# Patient Record
Sex: Female | Born: 1970 | Race: White | Hispanic: No | Marital: Married | Smoking: Never smoker
Health system: Southern US, Community
[De-identification: ages and names within clinical notes are randomized; demographics above are authoritative.]

## PROBLEM LIST (undated history)

## (undated) DIAGNOSIS — I10 Essential (primary) hypertension: Secondary | ICD-10-CM

## (undated) HISTORY — PX: LEEP: SHX91

## (undated) HISTORY — DX: Essential (primary) hypertension: I10

---

## 2013-01-06 ENCOUNTER — Encounter (HOSPITAL_BASED_OUTPATIENT_CLINIC_OR_DEPARTMENT_OTHER): Payer: Self-pay | Admitting: Emergency Medicine

## 2013-01-06 ENCOUNTER — Emergency Department (HOSPITAL_BASED_OUTPATIENT_CLINIC_OR_DEPARTMENT_OTHER): Payer: Managed Care, Other (non HMO)

## 2013-01-06 ENCOUNTER — Emergency Department (HOSPITAL_BASED_OUTPATIENT_CLINIC_OR_DEPARTMENT_OTHER)
Admission: EM | Admit: 2013-01-06 | Discharge: 2013-01-06 | Disposition: A | Discharge status: Home / Self Care | Payer: Managed Care, Other (non HMO) | Attending: Emergency Medicine | Admitting: Emergency Medicine

## 2013-01-06 DIAGNOSIS — S8990XA Unspecified injury of unspecified lower leg, initial encounter: Secondary | ICD-10-CM | POA: Insufficient documentation

## 2013-01-06 DIAGNOSIS — S90212A Contusion of left great toe with damage to nail, initial encounter: Secondary | ICD-10-CM

## 2013-01-06 DIAGNOSIS — W208XXA Other cause of strike by thrown, projected or falling object, initial encounter: Secondary | ICD-10-CM | POA: Insufficient documentation

## 2013-01-06 DIAGNOSIS — S90129A Contusion of unspecified lesser toe(s) without damage to nail, initial encounter: Secondary | ICD-10-CM | POA: Insufficient documentation

## 2013-01-06 DIAGNOSIS — Y929 Unspecified place or not applicable: Secondary | ICD-10-CM | POA: Insufficient documentation

## 2013-01-06 DIAGNOSIS — Y939 Activity, unspecified: Secondary | ICD-10-CM | POA: Insufficient documentation

## 2013-01-06 DIAGNOSIS — S99922A Unspecified injury of left foot, initial encounter: Secondary | ICD-10-CM

## 2013-01-06 DIAGNOSIS — S99929A Unspecified injury of unspecified foot, initial encounter: Secondary | ICD-10-CM

## 2013-01-06 MED ORDER — OXYCODONE-ACETAMINOPHEN 5-325 MG PO TABS: 1.0000 | ORAL_TABLET | Freq: Four times a day (QID) | ORAL | Status: DC | PRN

## 2013-01-06 MED ORDER — ACETAMINOPHEN 325 MG PO TABS
650.0000 mg | ORAL_TABLET | Freq: Once | ORAL | Status: AC
  Administered 2013-01-06: 650 mg via ORAL
  Filled 2013-01-06: qty 2

## 2013-01-06 NOTE — ED Provider Notes (Signed)
CSN: [id number]     Arrival date & time 01/06/13  1851 History  This chart was scribed for Junius Argyle, MD by Bennett Scrape, ED Scribe. This patient was seen in room MH01/MH01 and the patient's care was started at 8:17 PM.   Chief Complaint  Patient presents with  . Toe Injury    Patient is a 42 y.o. female presenting with toe pain. The history is provided by the patient. No language interpreter was used.  Toe Pain This is a new problem. Episode onset: this morning. The problem occurs constantly. The problem has been gradually worsening. Pertinent negatives include no chest pain, no abdominal pain, no headaches and no shortness of breath. The symptoms are aggravated by walking. The symptoms are relieved by rest. Treatments tried: Advil. The treatment provided mild relief.    HPI Comments: Alice Young is a 42 y.o. female who presents to the Emergency Department complaining of left great toe pain that started this morning after she dropped her trash can on it. She reports that the trash can had a metal ring along the bottom which hit the toenail head on and reports that she has since then been on her feet all day at work worsening the pain. She describes the pain as a throbbing and stabbing pain that is worse with weight bearing. She rates her pain an 8 out of 10 currently but states that she has not experienced decreased ROM due to the pain. She states that she took one Advil with mild improvement during the original onset but has not taken anything else since. She denies any other injuries.  No past medical history on file. No past surgical history on file. No family history on file. History  Substance Use Topics  . Smoking status: Never Smoker   . Smokeless tobacco: Not on file  . Alcohol Use: Not on file   No OB history provided.  Review of Systems  Constitutional: Negative for fever and fatigue.  HENT: Negative for congestion and drooling.   Eyes: Negative for pain.   Respiratory: Negative for cough and shortness of breath.   Cardiovascular: Negative for chest pain.  Gastrointestinal: Negative for nausea, vomiting, abdominal pain and diarrhea.  Genitourinary: Negative for dysuria and hematuria.  Musculoskeletal: Positive for arthralgias. Negative for neck pain.  Skin: Positive for wound. Negative for color change.  Neurological: Negative for dizziness and headaches.  Hematological: Negative for adenopathy.  Psychiatric/Behavioral: Negative for behavioral problems.  All other systems reviewed and are negative.    Allergies  Erythromycin base  Home Medications  No current outpatient prescriptions on file.  Triage Vitals: BP 128/87  Pulse 86  Temp(Src) 98.3 F (36.8 C)  Resp 16  Ht 5\' 3"  (1.6 m)  Wt 141 lb (63.957 kg)  BMI 24.98 kg/m2  SpO2 100%  LMP 01/03/2013  Physical Exam  Nursing note and vitals reviewed. Constitutional: She is oriented to person, place, and time. She appears well-developed and well-nourished. No distress.  HENT:  Head: Normocephalic and atraumatic.  Eyes: EOM are normal.  Neck: Neck supple. No tracheal deviation present.  Cardiovascular: Normal rate and regular rhythm.   Pulmonary/Chest: Effort normal and breath sounds normal. No respiratory distress.  Abdominal: Soft. There is no tenderness.  Musculoskeletal: Normal range of motion.  Subungual hematoma of left great toe , 2+ distal pulses, normal ROM  Neurological: She is alert and oriented to person, place, and time.  Skin: Skin is warm and dry.  Psychiatric: She has a  normal mood and affect. Her behavior is normal.    ED Course  Procedures (including critical care time)  DIAGNOSTIC STUDIES: Oxygen Saturation is 100% on room air, normal by my interpretation.    COORDINATION OF CARE: 8:16 PM-Informed pt of x-rays. Discussed treatment plan which includes trephining of subungual hematoma with pt at bedside and pt agreed to plan.   Labs Review Labs  Reviewed - No data to display Imaging Review Dg Toe Great Left  01/06/2013   CLINICAL DATA:  Dropped trash can on left great toe, pain, discoloration, injury  EXAM: LEFT GREAT TOE  COMPARISON:  None  FINDINGS: Osseous mineralization normal.  Joint spaces preserved.  No acute fracture, dislocation or bone destruction.  IMPRESSION: No acute osseous abnormalities.   Electronically Signed   By: Ulyses Southward M.D.   On: 01/06/2013 19:28    EKG Interpretation   None       MDM   1. Toe injury, left, initial encounter   2. Subungual hematoma of great toe of left foot, initial encounter    8:36 PM 42 y.o. female presents with left great toe injury after dropping a trash can on this morning. The patient notes continued pain but has normal range of motion of the toe on exam. She has evidence of a subungual hematoma. Plain film imaging is noncontributory. I performed the trephination at the bedside. The patient had mild relief and dark red blood was found to come out of the nail.  8:37 PM:  I have discussed the diagnosis/risks/treatment options with the patient and believe the pt to be eligible for discharge home to follow-up with pcp as needed. We also discussed returning to the ED immediately if new or worsening sx occur. We discussed the sx which are most concerning (e.g., worsening pain) that necessitate immediate return. Any new prescriptions provided to the patient are listed below.  New Prescriptions   OXYCODONE-ACETAMINOPHEN (PERCOCET) 5-325 MG PER TABLET    Take 1 tablet by mouth every 6 (six) hours as needed for pain.      I personally performed the services described in this documentation, which was scribed in my presence. The recorded information has been reviewed and is accurate.    Junius Argyle, MD 01/07/13 1055

## 2013-01-06 NOTE — ED Notes (Signed)
Motor and sensation intact.

## 2013-01-06 NOTE — ED Notes (Signed)
Pt dropped trashcan on left foot this am.  Left great toe is swollen and has blood under nail.

## 2013-10-07 ENCOUNTER — Encounter (HOSPITAL_BASED_OUTPATIENT_CLINIC_OR_DEPARTMENT_OTHER): Payer: Self-pay | Admitting: Emergency Medicine

## 2013-10-07 ENCOUNTER — Emergency Department (HOSPITAL_BASED_OUTPATIENT_CLINIC_OR_DEPARTMENT_OTHER)
Admission: EM | Admit: 2013-10-07 | Discharge: 2013-10-07 | Disposition: A | Discharge status: Home / Self Care | Payer: Managed Care, Other (non HMO) | Attending: Emergency Medicine | Admitting: Emergency Medicine

## 2013-10-07 DIAGNOSIS — IMO0002 Reserved for concepts with insufficient information to code with codable children: Secondary | ICD-10-CM | POA: Insufficient documentation

## 2013-10-07 DIAGNOSIS — Y929 Unspecified place or not applicable: Secondary | ICD-10-CM | POA: Insufficient documentation

## 2013-10-07 DIAGNOSIS — S0990XA Unspecified injury of head, initial encounter: Secondary | ICD-10-CM | POA: Insufficient documentation

## 2013-10-07 DIAGNOSIS — Y9389 Activity, other specified: Secondary | ICD-10-CM | POA: Insufficient documentation

## 2013-10-07 MED ORDER — ALPRAZOLAM 0.25 MG PO TABS: 0.2500 mg | ORAL_TABLET | Freq: Two times a day (BID) | ORAL | Status: AC | PRN

## 2013-10-07 NOTE — ED Provider Notes (Signed)
CSN: [id number]     Arrival date & time 10/07/13  1016 History   First MD Initiated Contact with Patient 10/07/13 1052     Chief Complaint  Patient presents with  . Head Injury     (Consider location/radiation/quality/duration/timing/severity/associated sxs/prior Treatment) Patient is a 43 y.o. female presenting with head injury. The history is provided by the patient.  Head Injury Location:  Frontal and L temporal Time since incident:  2 days Mechanism of injury: direct blow   Pain details:    Quality:  Aching   Radiates to:  L ear   Severity:  Mild   Duration:  2 days   Timing:  Constant   Progression:  Unchanged Chronicity:  New Relieved by:  Nothing Worsened by:  Nothing tried Ineffective treatments:  NSAIDs Associated symptoms: headache   Associated symptoms: no nausea, no neck pain and no vomiting   Headaches:    Severity:  Mild   Onset quality:  Sudden   Duration:  2 days   Timing:  Constant   Progression:  Improving   Chronicity:  New   History reviewed. No pertinent past medical history. History reviewed. No pertinent past surgical history. No family history on file. History  Substance Use Topics  . Smoking status: Never Smoker   . Smokeless tobacco: Not on file  . Alcohol Use: Not on file   OB History   Grav Para Term Preterm Abortions TAB SAB Ect Mult Living                 Review of Systems  Constitutional: Negative for fever and fatigue.  HENT: Negative for congestion and drooling.   Eyes: Negative for pain.  Respiratory: Negative for cough and shortness of breath.   Cardiovascular: Negative for chest pain.  Gastrointestinal: Negative for nausea, vomiting, abdominal pain and diarrhea.  Genitourinary: Negative for dysuria and hematuria.  Musculoskeletal: Negative for back pain, gait problem and neck pain.  Skin: Negative for color change.  Neurological: Positive for headaches. Negative for dizziness.  Hematological: Negative for adenopathy.   Psychiatric/Behavioral: Negative for behavioral problems.  All other systems reviewed and are negative.     Allergies  Erythromycin base  Home Medications   Prior to Admission medications   Not on File   BP 146/97  Pulse 95  Temp(Src) 97.9 F (36.6 C) (Oral)  Resp 18  SpO2 99% Physical Exam  Nursing note and vitals reviewed. Constitutional: She is oriented to person, place, and time. She appears well-developed and well-nourished.  HENT:  Head: Normocephalic.  Mouth/Throat: Oropharynx is clear and moist. No oropharyngeal exudate.  Eyes: Conjunctivae and EOM are normal. Pupils are equal, round, and reactive to light.  Neck: Normal range of motion. Neck supple.  Cardiovascular: Normal rate, regular rhythm, normal heart sounds and intact distal pulses.  Exam reveals no gallop and no friction rub.   No murmur heard. Pulmonary/Chest: Effort normal and breath sounds normal. No respiratory distress. She has no wheezes.  Abdominal: Soft. Bowel sounds are normal. There is no tenderness. There is no rebound and no guarding.  Musculoskeletal: Normal range of motion. She exhibits no edema and no tenderness.  Neurological: She is alert and oriented to person, place, and time.  alert, oriented x3 speech: normal in context and clarity memory: intact grossly cranial nerves II-XII: intact motor strength: full proximally and distally no involuntary movements or tremors sensation: intact to light touch diffusely  cerebellar: finger-to-nose and heel-to-shin intact gait: normal forwards and backwards  Skin: Skin is warm and dry.  Psychiatric: She has a normal mood and affect. Her behavior is normal.    ED Course  Procedures (including critical care time) Labs Review Labs Reviewed - No data to display  Imaging Review No results found.   EKG Interpretation None      MDM   Final diagnoses:  Head injury, initial encounter    10:55 AM 43 y.o. female who presents with a  injury which occurred 2 days ago. She states that her child's head hit her for head while the child was on the bed. She did not lose consciousness. She has had ongoing mild headache since that time. It is frontal and left temporal. Currently a 2/10. She has a normal neurologic exam. Possibly a mild concussion but do not think any CT imaging is needed at this time. Will recommend she continue using NSAIDs. Of note she is under increasing amounts of stress which is most likely not helping her headache improve. Will give her a small prescription for Xanax to use at home on an as needed basis.  11:06 AM:  I have discussed the diagnosis/risks/treatment options with the patient and believe the pt to be eligible for discharge home to follow-up with her pcp as needed. We also discussed returning to the ED immediately if new or worsening sx occur. We discussed the sx which are most concerning (e.g., worsening HA, fever, ataxia) that necessitate immediate return. Medications administered to the patient during their visit and any new prescriptions provided to the patient are listed below.  Medications given during this visit Medications - No data to display  New Prescriptions   No medications on file     Junius ArgyleForrest S Mattheus Rauls, MD 10/07/13 1109

## 2013-10-07 NOTE — ED Notes (Signed)
Patient here with ongoing headache after hitting head hard against childs head on Friday morning. Took tylenol with minimal relief, now having increased pain and pressure with radiation to left ear, reports that she is more tearful and emotional than usual with increased tiredness. Alert and oriented

## 2013-10-07 NOTE — Discharge Instructions (Signed)
Concussion °A concussion is a brain injury. It is caused by: °· A hit to the head. °· A quick and sudden movement (jolt) of the head or neck. °A concussion is usually not life threatening. Even so, it can cause serious problems. If you had a concussion before, you may have concussion-like problems after a hit to your head. °HOME CARE °General Instructions °· Follow your doctor's directions carefully. °· Take medicines only as told by your doctor. °· Only take medicines your doctor says are safe. °· Do not drink alcohol until your doctor says it is okay. Alcohol and some drugs can slow down healing. They can also put you at risk for further injury. °· If you are having trouble remembering things, write them down. °· Try to do one thing at a time if you get distracted easily. For example, do not watch TV while making dinner. °· Talk to your family members or close friends when making important decisions. °· Follow up with your doctor as told. °· Watch your symptoms. Tell others to do the same. Serious problems can sometimes happen after a concussion. Older adults are more likely to have these problems. °· Tell your teachers, school nurse, school counselor, coach, athletic trainer, or work manager about your concussion. Tell them about what you can or cannot do. They should watch to see if: °¨ It gets even harder for you to pay attention or concentrate. °¨ It gets even harder for you to remember things or learn new things. °¨ You need more time than normal to finish things. °¨ You become annoyed (irritable) more than before. °¨ You are not able to deal with stress as well. °¨ You have more problems than before. °· Rest. Make sure you: °¨ Get plenty of sleep at night. °¨ Go to sleep early. °¨ Go to bed at the same time every day. Try to wake up at the same time. °¨ Rest during the day. °¨ Take naps when you feel tired. °· Limit activities where you have to think a lot or concentrate. These include: °¨ Doing  homework. °¨ Doing work related to a job. °¨ Watching TV. °¨ Using the computer. °Returning To Your Regular Activities °Return to your normal activities slowly, not all at once. You must give your body and brain enough time to heal.  °· Do not play sports or do other athletic activities until your doctor says it is okay. °· Ask your doctor when you can drive, ride a bicycle, or work other vehicles or machines. Never do these things if you feel dizzy. °· Ask your doctor about when you can return to work or school. °Preventing Another Concussion °It is very important to avoid another brain injury, especially before you have healed. In rare cases, another injury can lead to permanent brain damage, brain swelling, or death. The risk of this is greatest during the first 7-10 days after your injury. Avoid injuries by:  °· Wearing a seat belt when riding in a car. °· Not drinking too much alcohol. °· Avoiding activities that could lead to a second concussion (such as contact sports). °· Wearing a helmet when doing activities like: °¨ Biking. °¨ Skiing. °¨ Skateboarding. °¨ Skating. °· Making your home safer by: °¨ Removing things from the floor or stairways that could make you trip. °¨ Using grab bars in bathrooms and handrails by stairs. °¨ Placing non-slip mats on floors and in bathtubs. °¨ Improve lighting in dark areas. °GET HELP IF: °· It   gets even harder for you to pay attention or concentrate. °· It gets even harder for you to remember things or learn new things. °· You need more time than normal to finish things. °· You become annoyed (irritable) more than before. °· You are not able to deal with stress as well. °· You have more problems than before. °· You have problems keeping your balance. °· You are not able to react quickly when you should. °Get help if you have any of these problems for more than 2 weeks:  °· Lasting (chronic) headaches. °· Dizziness or trouble balancing. °· Feeling sick to your stomach  (nausea). °· Seeing (vision) problems. °· Being affected by noises or light more than normal. °· Feeling sad, low, down in the dumps, blue, gloomy, or empty (depressed). °· Mood changes (mood swings). °· Feeling of fear or nervousness about what may happen (anxiety). °· Feeling annoyed. °· Memory problems. °· Problems concentrating or paying attention. °· Sleep problems. °· Feeling tired all the time. °GET HELP RIGHT AWAY IF:  °· You have bad headaches or your headaches get worse. °· You have weakness (even if it is in one hand, leg, or part of the face). °· You have loss of feeling (numbness). °· You feel off balance. °· You keep throwing up (vomiting). °· You feel tired. °· One black center of your eye (pupil) is larger than the other. °· You twitch or shake violently (convulse). °· Your speech is not clear (slurred). °· You are more confused, easily angered (agitated), or annoyed than before. °· You have more trouble resting than before. °· You are unable to recognize people or places. °· You have neck pain. °· It is difficult to wake you up. °· You have unusual behavior changes. °· You pass out (lose consciousness). °MAKE SURE YOU:  °· Understand these instructions. °· Will watch your condition. °· Will get help right away if you are not doing well or get worse. °Document Released: 02/10/2009 Document Revised: 07/09/2013 Document Reviewed: 09/14/2012 °[employer]® Patient Information ©2015 [employer]. This information is not intended to replace advice given to you by your health care provider. Make sure you discuss any questions you have with your health care provider. ° °

## 2014-08-17 IMAGING — CR DG TOE GREAT 2+V*L*
3 series · 3 of 3 positions shown · non-contrast
Comparison: None

CLINICAL DATA: Dropped trash can on left great toe, pain,
discoloration, injury

EXAM:
LEFT GREAT TOE

[t toes ap left]
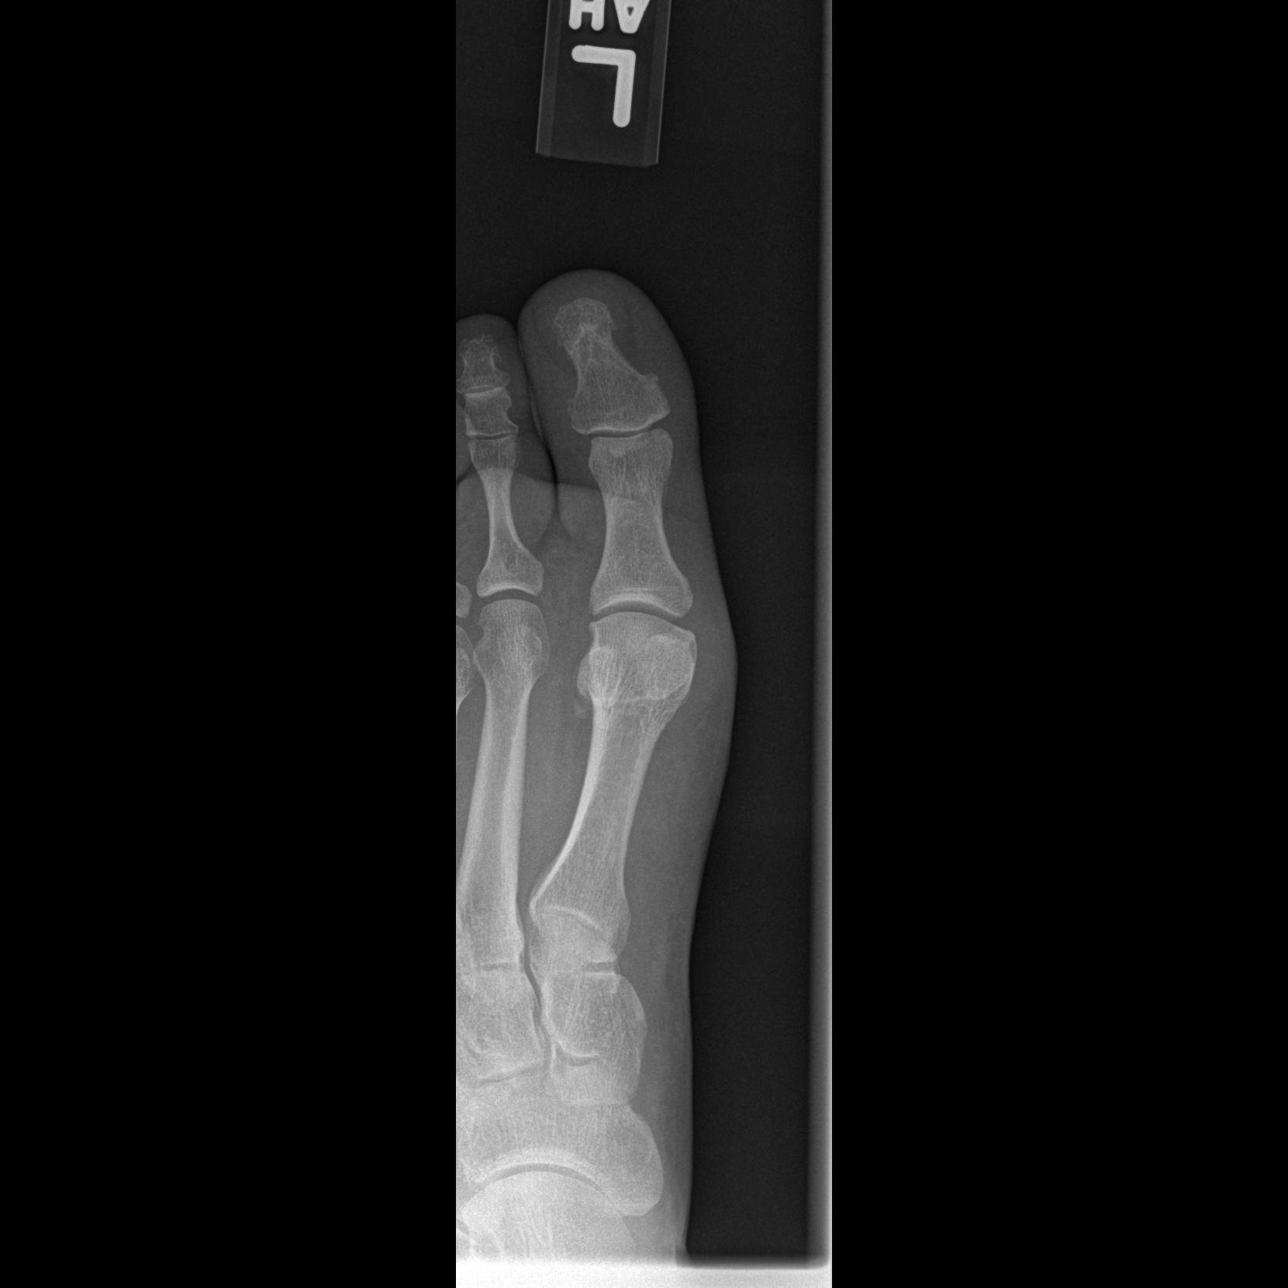

[t toes oblique left]
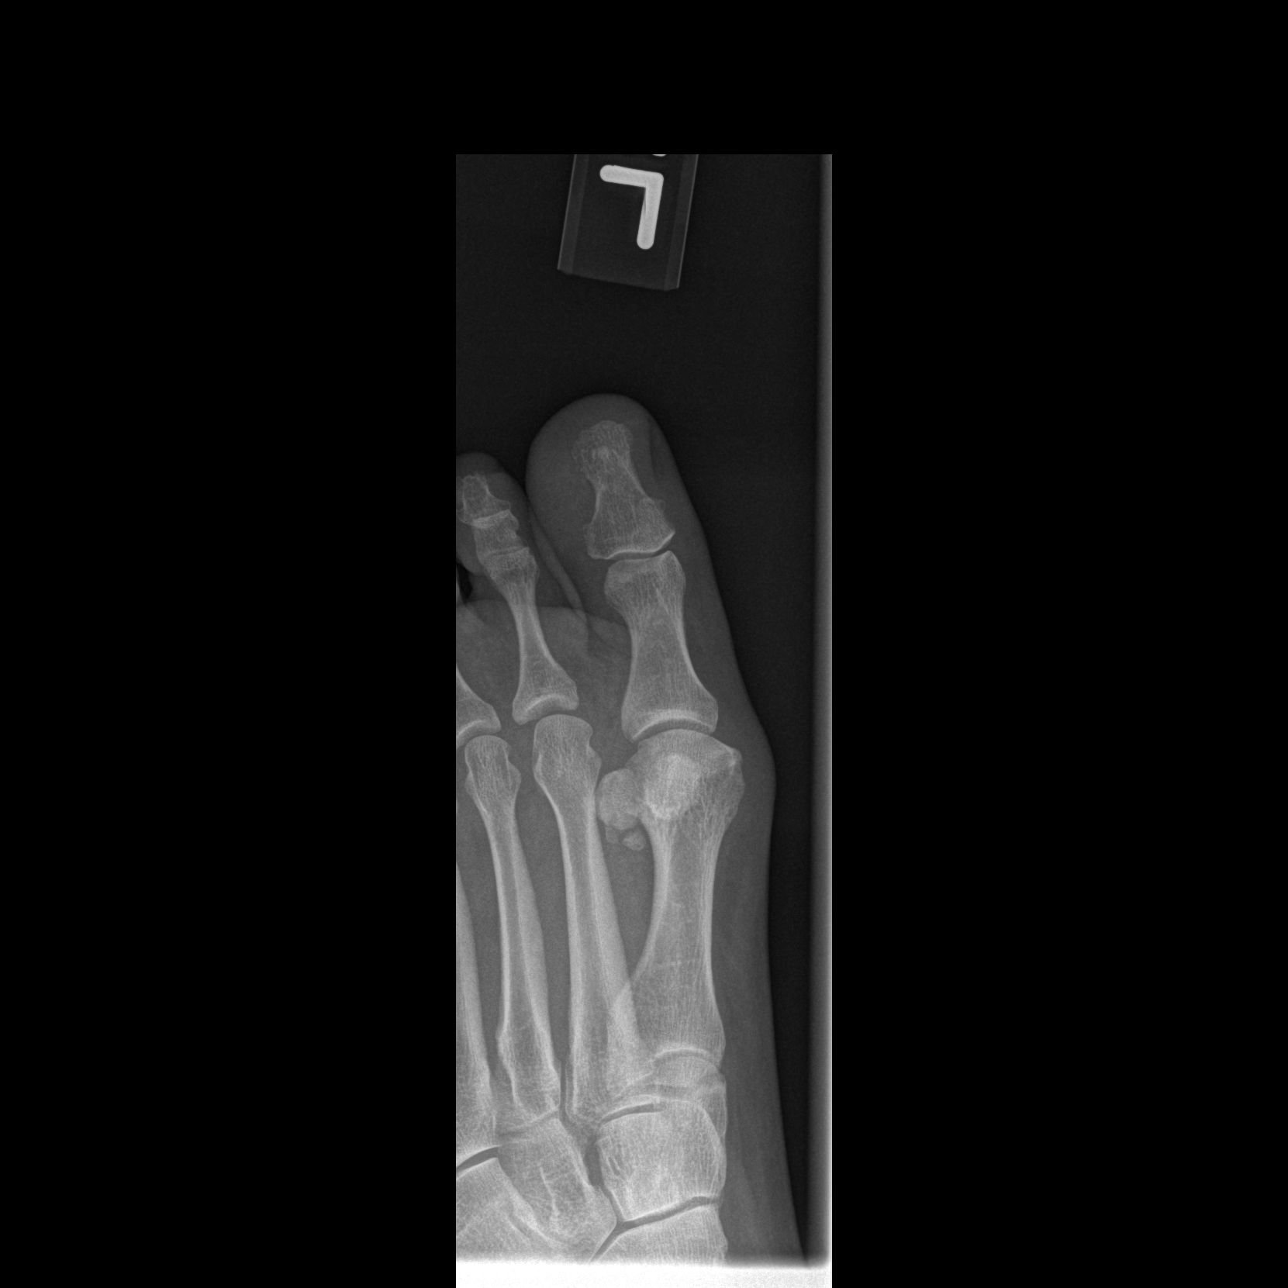

[t toes lateral left]
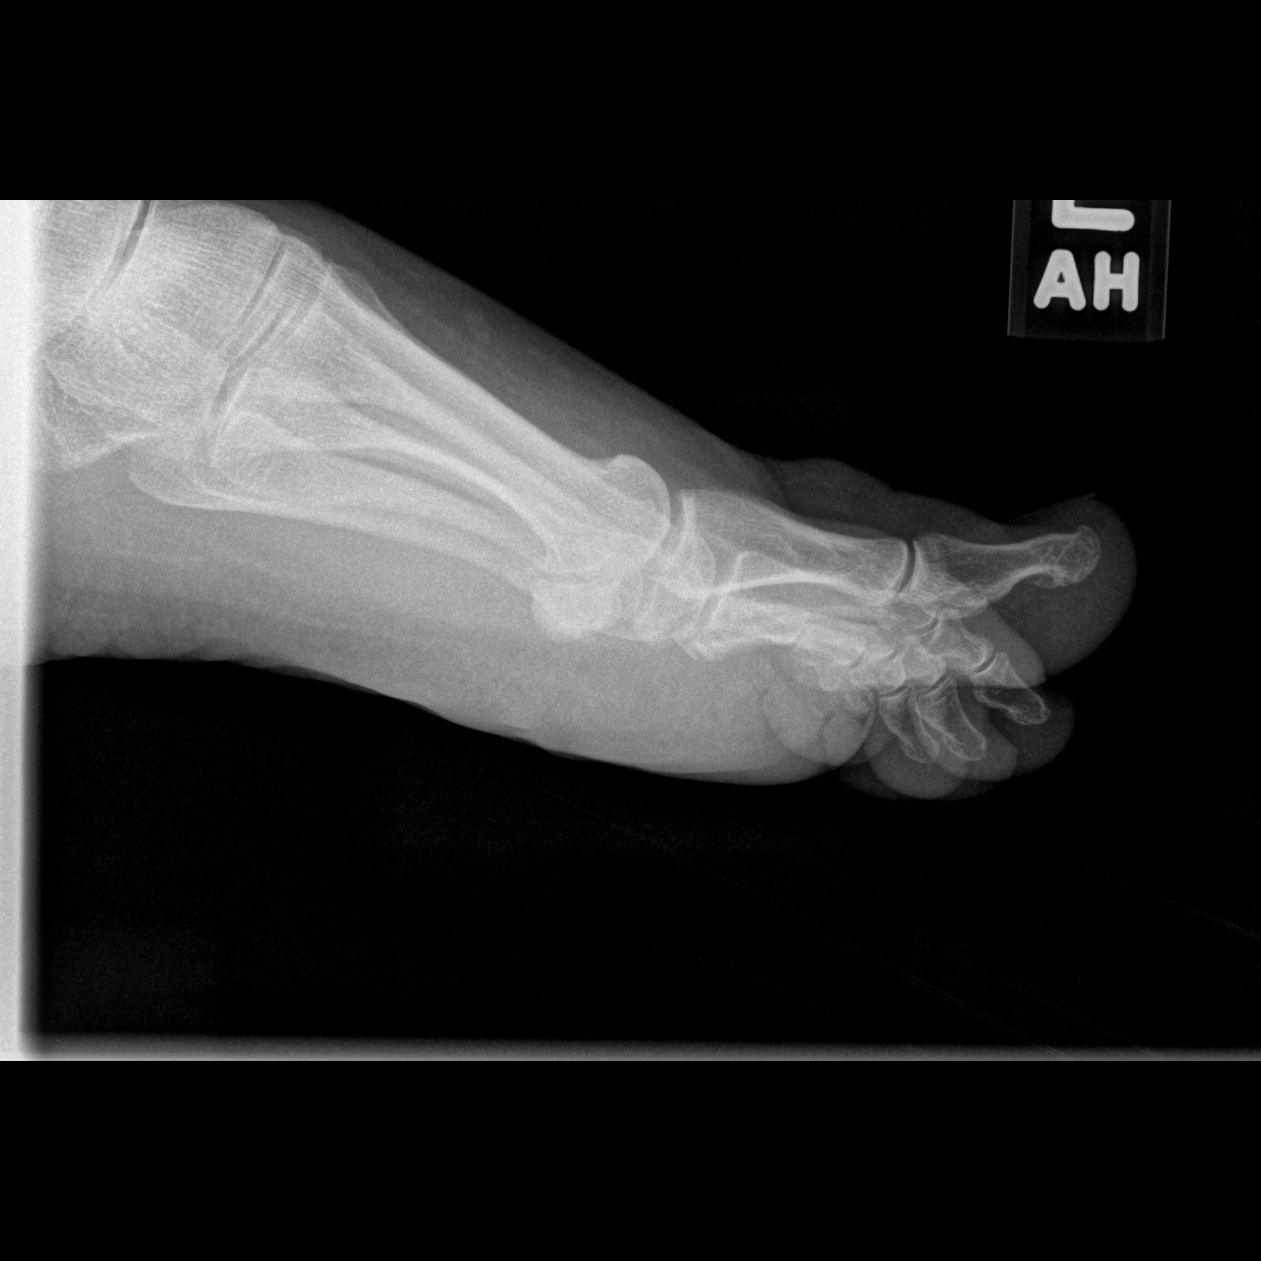

[3 of 3 positions shown; findings below may reference images not displayed]

FINDINGS: Osseous mineralization normal.

Joint spaces preserved.

No acute fracture, dislocation or bone destruction.
IMPRESSION: No acute osseous abnormalities.

## 2017-06-06 ENCOUNTER — Encounter (HOSPITAL_BASED_OUTPATIENT_CLINIC_OR_DEPARTMENT_OTHER): Payer: Self-pay | Admitting: Emergency Medicine

## 2017-06-06 ENCOUNTER — Other Ambulatory Visit: Payer: Self-pay

## 2017-06-06 ENCOUNTER — Emergency Department (HOSPITAL_BASED_OUTPATIENT_CLINIC_OR_DEPARTMENT_OTHER)
Admission: EM | Admit: 2017-06-06 | Discharge: 2017-06-06 | Disposition: A | Discharge status: Home / Self Care | Payer: Managed Care, Other (non HMO) | Attending: Emergency Medicine | Admitting: Emergency Medicine

## 2017-06-06 DIAGNOSIS — R112 Nausea with vomiting, unspecified: Secondary | ICD-10-CM | POA: Insufficient documentation

## 2017-06-06 DIAGNOSIS — R197 Diarrhea, unspecified: Secondary | ICD-10-CM | POA: Diagnosis not present

## 2017-06-06 LAB — URINALYSIS, ROUTINE W REFLEX MICROSCOPIC
Bilirubin Urine: NEGATIVE
Glucose, UA: NEGATIVE mg/dL
HGB URINE DIPSTICK: NEGATIVE
Ketones, ur: NEGATIVE mg/dL
Leukocytes, UA: NEGATIVE
Nitrite: NEGATIVE
Protein, ur: NEGATIVE mg/dL
SPECIFIC GRAVITY, URINE: 1.015 (ref 1.005–1.030)
pH: 6 (ref 5.0–8.0)

## 2017-06-06 LAB — BASIC METABOLIC PANEL
Anion gap: 12 (ref 5–15)
BUN: 12 mg/dL (ref 6–20)
CO2: 21 mmol/L — ABNORMAL LOW (ref 22–32)
Calcium: 8.7 mg/dL — ABNORMAL LOW (ref 8.9–10.3)
Chloride: 103 mmol/L (ref 101–111)
Creatinine, Ser: 0.81 mg/dL (ref 0.44–1.00)
GFR calc non Af Amer: >60 mL/min (ref >60)
Glucose, Bld: 138 mg/dL — ABNORMAL HIGH (ref 65–99)
POTASSIUM: 3.9 mmol/L (ref 3.5–5.1)
SODIUM: 136 mmol/L (ref 135–145)

## 2017-06-06 LAB — CBC WITH DIFFERENTIAL/PLATELET
Basophils Absolute: 0 10*3/uL (ref 0.0–0.1)
Basophils Relative: 0 %
Eosinophils Absolute: 0 10*3/uL (ref 0.0–0.7)
Eosinophils Relative: 0 %
HCT: 42.9 % (ref 36.0–46.0)
Hemoglobin: 14.6 g/dL (ref 12.0–15.0)
LYMPHS ABS: 0.3 10*3/uL — AB (ref 0.7–4.0)
LYMPHS PCT: 2 %
MCH: 30.4 pg (ref 26.0–34.0)
MCHC: 34 g/dL (ref 30.0–36.0)
MCV: 89.2 fL (ref 78.0–100.0)
Monocytes Absolute: 0.4 10*3/uL (ref 0.1–1.0)
Monocytes Relative: 3 %
Neutro Abs: 12.6 10*3/uL — ABNORMAL HIGH (ref 1.7–7.7)
Neutrophils Relative %: 95 %
Platelets: 274 10*3/uL (ref 150–400)
RBC: 4.81 MIL/uL (ref 3.87–5.11)
RDW: 12.8 % (ref 11.5–15.5)
WBC: 13.4 10*3/uL — AB (ref 4.0–10.5)

## 2017-06-06 MED ORDER — ONDANSETRON HCL 4 MG/2ML IJ SOLN: 4.0000 mg | Freq: Once | INTRAMUSCULAR | Status: DC

## 2017-06-06 MED ORDER — ONDANSETRON HCL 4 MG/2ML IJ SOLN
4.0000 mg | Freq: Once | INTRAMUSCULAR | Status: AC
  Administered 2017-06-06: 4 mg via INTRAVENOUS
  Filled 2017-06-06: qty 2

## 2017-06-06 MED ORDER — SODIUM CHLORIDE 0.9 % IV BOLUS
1000.0000 mL | Freq: Once | INTRAVENOUS | Status: AC
  Administered 2017-06-06: 1000 mL via INTRAVENOUS

## 2017-06-06 MED ORDER — KETOROLAC TROMETHAMINE 30 MG/ML IJ SOLN
30.0000 mg | Freq: Once | INTRAMUSCULAR | Status: AC
  Administered 2017-06-06: 30 mg via INTRAVENOUS
  Filled 2017-06-06: qty 1

## 2017-06-06 MED ORDER — ONDANSETRON HCL 4 MG PO TABS: 4.0000 mg | ORAL_TABLET | Freq: Four times a day (QID) | ORAL | 0 refills | Status: AC | PRN

## 2017-06-06 NOTE — ED Notes (Signed)
Alert, NAD, calm, interactive, resps e/u, speaking in clear complete sentences, no dyspnea noted, skin W&D, VSS, c/o periumbilical spasms and cramping, associated with NVD and chills, onset 1900, (denies: other pain, urinary or vaginal sx, sob, constipation, bleeding, dizziness or visual changes). [facility], Dx[facility]. Husband and children are recent sick contacts (GI bug).

## 2017-06-06 NOTE — Discharge Instructions (Addendum)
Take loperamide (Imodium AD) as needed for diarrhea. Take acetaminophen or ibuprofen as needed for fever or aching. Return if symptoms are not being adequately controlled at home.

## 2017-06-06 NOTE — ED Triage Notes (Signed)
C/o n/v/d, fever, abd pain since 7pm today. States her kids have recently had same sx.

## 2017-06-06 NOTE — ED Notes (Signed)
Updated on wait, "feel better, nausea resolved, intestinal cramping remains". [facility]SS[facility]

## 2017-06-06 NOTE — ED Notes (Signed)
EDP at [facility] 

## 2017-06-06 NOTE — ED Notes (Signed)
EDP into room 

## 2017-06-06 NOTE — ED Notes (Signed)
Up to b/r, steady gait 

## 2017-06-06 NOTE — ED Provider Notes (Signed)
[facility] EMERGENCY DEPARTMENT Provider Note   CSN: [id number] Arrival date & time: 06/06/17  0013     History   Chief Complaint Chief Complaint  Patient presents with  . Emesis    HPI Alice Young is a 47 y.o. female.  The history is provided by the patient.  She had onset about 7PM nausea, vomiting, abdominal cramping.  She states she had a knot in her mid abdomen for about the last few days.  She is vomited about 10 times with about 10 episodes of diarrhea.  There is been no blood or mucus in the stool or emesis.  She denies fever but has had some chills.  There have been no sweats.  She denies arthralgias or myalgias.  Abdominal cramping, when present, was as severe as 8/10, but has subsided greatly.  She did receive a dose of ondansetron at triage, and nausea and cramps have subsided.  She no longer feels like she is going to have more diarrhea.  She has had sick contacts and that her children and her spouse have had a similar illness recently.  There has been no treatment at home.  History reviewed. No pertinent past medical history.  Patient Active Problem List   Diagnosis Date Noted  . Toe injury 01/06/2013  . Subungual hematoma of great toe of left foot 01/06/2013    Past Surgical History:  Procedure Laterality Date  . LEEP       OB History   None      Home Medications    Prior to Admission medications   Medication Sig Start Date End Date Taking? Authorizing Provider  NUTRITIONAL SUPPLEMENTS PO Take by mouth.   Yes [provider]  ALPRAZolam (XANAX) 0.25 MG tablet Take 1 tablet (0.25 mg total) by mouth 2 (two) times daily as needed for anxiety. 10/07/13   Purvis Sheffield, MD    Family History No family history on file.  Social History Social History   Tobacco Use  . Smoking status: Never Smoker  . Smokeless tobacco: Never Used  Substance Use Topics  . Alcohol use: Never    Frequency: Never  . Drug use: Never      Allergies   Erythromycin base   Review of Systems Review of Systems  All other systems reviewed and are negative.    Physical Exam Updated Vital Signs BP 115/75   Pulse 90   Temp 100.2 F (37.9 C) (Oral)   Resp 20   Ht 5\' 2"  (1.575 m)   Wt 69.9 kg (154 lb)   LMP 05/23/2017   SpO2 99%   BMI 28.17 kg/m   Physical Exam  Nursing note and vitals reviewed.  47 year old female, resting comfortably and in no acute distress. Vital signs are normal. Oxygen saturation is 99%, which is normal. Head is normocephalic and atraumatic. PERRLA, EOMI. Oropharynx is clear. Neck is nontender and supple without adenopathy or JVD. Back is nontender and there is no CVA tenderness. Lungs are clear without rales, wheezes, or rhonchi. Chest is nontender. Heart has regular rate and rhythm without murmur. Abdomen is soft, flat, nontender without masses or hepatosplenomegaly and peristalsis is hypoactive. Extremities have no cyanosis or edema, full range of motion is present. Skin is warm and dry without rash. Neurologic: Mental status is normal, cranial nerves are intact, there are no motor or sensory deficits.  ED Treatments / Results  Labs (all labs ordered are listed, but only abnormal results are displayed) Labs Reviewed  BASIC METABOLIC PANEL - Abnormal; Notable for the following components:      Result Value   CO2 21 (*)    Glucose, Bld 138 (*)    Calcium 8.7 (*)    All other components within normal limits  CBC WITH DIFFERENTIAL/PLATELET - Abnormal; Notable for the following components:   WBC 13.4 (*)    Neutro Abs 12.6 (*)    Lymphs Abs 0.3 (*)    All other components within normal limits  URINALYSIS, ROUTINE W REFLEX MICROSCOPIC   Procedures Procedures (including critical care time)  Medications Ordered in ED Medications  ondansetron (ZOFRAN) injection 4 mg (0 mg Intravenous Hold 06/06/17 0348)  ondansetron (ZOFRAN) injection 4 mg (4 mg Intravenous Given 06/06/17 0134)   sodium chloride 0.9 % bolus 1,000 mL (0 mLs Intravenous Stopped 06/06/17 0450)  ketorolac (TORADOL) 30 MG/ML injection 30 mg (30 mg Intravenous Given 06/06/17 0353)     Initial Impression / Assessment and Plan / ED Course  I have reviewed the triage vital signs and the nursing notes.  Pertinent lab results that were available during my care of the patient were reviewed by me and considered in my medical decision making (see chart for details).  Nausea, vomiting, diarrhea and pattern strongly suggestive of viral gastroenteritis.  Presence of sick contacts makes this diagnosis more likely.  No evidence of small bowel obstruction, pancreatitis, diverticulitis.  Electrolytes show borderline low CO2 but with normal anion gap.  Glucose is mildly elevated at 138, and this will need to be followed as an outpatient, but is likely just secondary to the physiologic stress of her infection.  Will check CBC, urinalysis, and give additional IV fluids.  Old records are reviewed, and she has no relevant past visits.  WBC is mildly elevated with left shift consistent with acute viral gastroenteritis.  No red flags to suggest more serious pathology.  She feels much better after above-noted treatment.  She is discharged with prescription for ondansetron, told to use over-the-counter loperamide as needed for diarrhea.  Return precautions discussed.  Final Clinical Impressions(s) / ED Diagnoses   Final diagnoses:  Nausea vomiting and diarrhea    ED Discharge Orders        Ordered    ondansetron (ZOFRAN) 4 MG tablet  Every 6 hours PRN     06/06/17 0454       Dione BoozeGlick, Neale Marzette, MD 06/06/17 [phone]

## 2017-06-06 NOTE — ED Notes (Signed)
Alert, NAD, calm, interactive, resps e/u, no dyspnea noted, initial VSS, HR & temp elevated, steady gait from triage to exam room. Declined zofran offered by triage RN. [facility]

## 2022-11-27 ENCOUNTER — Encounter (HOSPITAL_COMMUNITY): Payer: Self-pay

## 2022-11-27 ENCOUNTER — Emergency Department (HOSPITAL_COMMUNITY)
Admission: EM | Admit: 2022-11-27 | Discharge: 2022-11-27 | Disposition: A | Discharge status: Home / Self Care | Payer: Managed Care, Other (non HMO) | Attending: Emergency Medicine | Admitting: Emergency Medicine

## 2022-11-27 ENCOUNTER — Other Ambulatory Visit: Payer: Self-pay

## 2022-11-27 ENCOUNTER — Emergency Department (HOSPITAL_COMMUNITY): Payer: Managed Care, Other (non HMO)

## 2022-11-27 DIAGNOSIS — R002 Palpitations: Secondary | ICD-10-CM | POA: Insufficient documentation

## 2022-11-27 DIAGNOSIS — I10 Essential (primary) hypertension: Secondary | ICD-10-CM | POA: Diagnosis not present

## 2022-11-27 DIAGNOSIS — R0789 Other chest pain: Secondary | ICD-10-CM | POA: Insufficient documentation

## 2022-11-27 LAB — CBC
HCT: 42.4 % (ref 36.0–46.0)
Hemoglobin: 13.8 g/dL (ref 12.0–15.0)
MCH: 28.6 pg (ref 26.0–34.0)
MCHC: 32.5 g/dL (ref 30.0–36.0)
MCV: 88 fL (ref 80.0–100.0)
Platelets: 300 10*3/uL (ref 150–400)
RBC: 4.82 MIL/uL (ref 3.87–5.11)
RDW: 13.1 % (ref 11.5–15.5)
WBC: 7.8 10*3/uL (ref 4.0–10.5)
nRBC: 0 % (ref 0.0–0.2)

## 2022-11-27 LAB — BASIC METABOLIC PANEL
Anion gap: 11 (ref 5–15)
BUN: 14 mg/dL (ref 6–20)
CO2: 23 mmol/L (ref 22–32)
Calcium: 9.7 mg/dL (ref 8.9–10.3)
Chloride: 103 mmol/L (ref 98–111)
Creatinine, Ser: 0.79 mg/dL (ref 0.44–1.00)
GFR, Estimated: >60 mL/min (ref >60)
Glucose, Bld: 102 mg/dL — ABNORMAL HIGH (ref 70–99)
Potassium: 4.4 mmol/L (ref 3.5–5.1)
Sodium: 137 mmol/L (ref 135–145)

## 2022-11-27 LAB — TROPONIN I (HIGH SENSITIVITY)
Troponin I (High Sensitivity): <2 ng/L (ref <18)
Troponin I (High Sensitivity): 3 ng/L (ref <18)

## 2022-11-27 LAB — PREGNANCY, URINE: Preg Test, Ur: NEGATIVE

## 2022-11-27 NOTE — ED Triage Notes (Signed)
[facility]. Pt was driving started having palpitations pulled [facility] . Having palpitations chest tightness and dizziness.  Pt had a panic attack at [facility]. Chest tightness and dizziness. Pt has a history of anxiety . Pt has hx of high blood pressure.  Pt took 324 of Asprin   Hr 87 Spo2 98%  Bp 144/94

## 2022-11-27 NOTE — ED Provider Notes (Signed)
[place] EMERGENCY DEPARTMENT AT [facility] Provider Note  CSN: [id number] Arrival date & time: 11/27/22 1019  Chief Complaint(s) Palpitations and Chest Pain  HPI Alice Young is a 52 y.o. female with past medical history as below, significant for htn who presents to the ED with complaint of cp/palpitations   Patient reports just prior to arrival she was driving, began to have irregular palpitation patient, chest squeezing, pressure, tightness.  Symptoms resolved after few minutes.  She went to a drugstore and asked them to call EMS and they were unable to call EMS so she reports that she started having a panic attack.  She is feeling much better at this time.  She feels tired but no chest pain, dyspnea or palpitations.  No recent diet or medication changes.  No illicit drug use reported.  No ongoing chest pain.  Denies history of arrhythmia in the past, no first-degree relatives with history of sudden cardiac death or early MACE that she is aware of.  Past Medical History Past Medical History:  Diagnosis Date   Hypertension    Patient Active Problem List   Diagnosis Date Noted   Toe injury 01/06/2013   Subungual hematoma of great toe of left foot 01/06/2013   Home Medication(s) Prior to Admission medications   Medication Sig Start Date End Date Taking? Authorizing Provider  ALPRAZolam (XANAX) 0.25 MG tablet Take 1 tablet (0.25 mg total) by mouth 2 (two) times daily as needed for anxiety. 10/07/13   Purvis Sheffield, MD  NUTRITIONAL SUPPLEMENTS PO Take by mouth.    [provider]  ondansetron (ZOFRAN) 4 MG tablet Take 1 tablet (4 mg total) by mouth every 6 (six) hours as needed for nausea or vomiting. 06/06/17   Dione Booze, MD                                                                                                                                    Past Surgical History Past Surgical History:  Procedure Laterality Date   LEEP     Family  History History reviewed. No pertinent family history.  Social History Social History   Tobacco Use   Smoking status: Never   Smokeless tobacco: Never  Substance Use Topics   Alcohol use: Never   Drug use: Never   Allergies Erythromycin base  Review of Systems Review of Systems  Constitutional:  Negative for chills and fever.  Respiratory:  Positive for chest tightness. Negative for cough and shortness of breath.   Cardiovascular:  Positive for chest pain and leg swelling.  Gastrointestinal:  Negative for abdominal pain, nausea and vomiting.  Genitourinary:  Negative for dysuria and urgency.  Neurological:  Positive for light-headedness.  Psychiatric/Behavioral:  The patient is nervous/anxious.   All other systems reviewed and are negative.   Physical Exam Vital Signs  I have reviewed the triage vital signs BP (!) 156/109   Pulse 79  Resp 18   Ht 5\' 2"  (1.575 m)   Wt 72.6 kg   SpO2 100%   BMI 29.26 kg/m  Physical Exam Vitals and nursing note reviewed.  Constitutional:      General: She is not in acute distress.    Appearance: Normal appearance. She is well-developed. She is not ill-appearing.  HENT:     Head: Normocephalic and atraumatic.     Right Ear: External ear normal.     Left Ear: External ear normal.     Nose: Nose normal.     Mouth/Throat:     Mouth: Mucous membranes are moist.  Eyes:     General: No scleral icterus.       Right eye: No discharge.        Left eye: No discharge.  Cardiovascular:     Rate and Rhythm: Normal rate and regular rhythm.     Pulses: Normal pulses.     Heart sounds: Normal heart sounds. No murmur heard.    No friction rub. No gallop. No S3 or S4 sounds.  Pulmonary:     Effort: Pulmonary effort is normal. No respiratory distress.     Breath sounds: Normal breath sounds. No stridor.  Abdominal:     General: Abdomen is flat. There is no distension.     Palpations: Abdomen is soft.     Tenderness: There is no abdominal  tenderness.  Musculoskeletal:     Cervical back: No rigidity.     Right lower leg: No edema.     Left lower leg: No edema.  Skin:    General: Skin is warm and dry.     Capillary Refill: Capillary refill takes less than 2 seconds.  Neurological:     Mental Status: She is alert.  Psychiatric:        Mood and Affect: Mood normal.        Behavior: Behavior normal. Behavior is cooperative.     ED Results and Treatments Labs (all labs ordered are listed, but only abnormal results are displayed) Labs Reviewed  BASIC METABOLIC PANEL - Abnormal; Notable for the following components:      Result Value   Glucose, Bld 102 (*)    All other components within normal limits  CBC  PREGNANCY, URINE  TROPONIN I (HIGH SENSITIVITY)  TROPONIN I (HIGH SENSITIVITY)                                                                                                                          Radiology DG Chest 2 View  Result Date: 11/27/2022 CLINICAL DATA:  Chest pain EXAM: CHEST - 2 VIEW COMPARISON:  08/22/2013 FINDINGS: The heart size and mediastinal contours are within normal limits. Both lungs are clear. The visualized skeletal structures are unremarkable. IMPRESSION: No active cardiopulmonary disease. Electronically Signed   By: Duanne Guess D.O.   On: 11/27/2022 12:09    Pertinent labs & imaging results that were available during my care of  the patient were reviewed by me and considered in my medical decision making (see MDM for details).  Medications Ordered in ED Medications - No data to display                                                                                                                                   Procedures Procedures  (including critical care time)  Medical Decision Making / ED Course    Medical Decision Making:    Alice Young is a 52 y.o. female with past medical history as below, significant for htn who presents to the ED with complaint of  cp/palpitations . The complaint involves an extensive differential diagnosis and also carries with it a high risk of complications and morbidity.  Serious etiology was considered. Ddx includes but is not limited to: Differential includes all life-threatening causes for chest pain. This includes but is not exclusive to acute coronary syndrome, aortic dissection, pulmonary embolism, cardiac tamponade, community-acquired pneumonia, pericarditis, musculoskeletal chest wall pain, etc.   Complete initial physical exam performed, notably the patient  was no acute distress, asymptomatic.    Reviewed and confirmed nursing documentation for past medical history, family history, social history.  Vital signs reviewed.        Patient symptoms have resolved.  Her labs are stable.  No arrhythmia on telemetry.  Recommend she get outpatient cardiac monitoring, Zio patch.  Follow-up with cardiology  The patient's chest pain is not suggestive of pulmonary embolus, cardiac ischemia, aortic dissection, pericarditis, myocarditis, pulmonary embolism, pneumothorax, pneumonia, Zoster, or esophageal perforation, or other serious etiology.  Historically not abrupt in onset, tearing or ripping, pulses symmetric. EKG nonspecific for ischemia/infarction. No dysrhythmias, brugada, WPW, prolonged QT noted.   Troponin negative x2. CXR reviewed. Labs without demonstration of acute pathology unless otherwise noted above. Low HEART Score: 0-3 points (0.9-1.7% risk of MACE).  Given the extremely low risk of these diagnoses further testing and evaluation for these possibilities does not appear to be indicated at this time. Patient in no distress and overall condition improved here in the ED. Detailed discussions were had with the patient regarding current findings, and need for close f/u with PCP or on call doctor. The patient has been instructed to return immediately if the symptoms worsen in any way for re-evaluation. Patient  verbalized understanding and is in agreement with current care plan. All questions answered prior to discharge.                  Additional history obtained: -Additional history obtained from family/spouse -External records from outside source obtained and reviewed including: Chart review including previous notes, labs, imaging, consultation notes including  Primary care documentation, home medications   Lab Tests: -I ordered, reviewed, and interpreted labs.   The pertinent results include:   Labs Reviewed  BASIC METABOLIC PANEL - Abnormal; Notable for the following components:      Result Value  Glucose, Bld 102 (*)    All other components within normal limits  CBC  PREGNANCY, URINE  TROPONIN I (HIGH SENSITIVITY)  TROPONIN I (HIGH SENSITIVITY)    Notable for troponin negative, labs stable  EKG   EKG Interpretation Date/Time:  Saturday November 27 2022 10:48:04 EDT Ventricular Rate:  70 PR Interval:  133 QRS Duration:  77 QT Interval:  384 QTC Calculation: 415 R Axis:   58  Text Interpretation: Sinus rhythm Confirmed by Tanda Rockers ([id number]) on 11/27/2022 11:18:37 AM         Imaging Studies ordered: I ordered imaging studies including chest x-ray I independently visualized the following imaging with scope of interpretation limited to determining acute life threatening conditions related to emergency care; findings noted above, significant for nonacute chest I independently visualized and interpreted imaging. I agree with the radiologist interpretation   Medicines ordered and prescription drug management: No orders of the defined types were placed in this encounter.   -I have reviewed the patients home medicines and have made adjustments as needed   Consultations Obtained: Not Applicable  Cardiac Monitoring: The patient was maintained on a cardiac monitor.  I personally viewed and interpreted the cardiac monitored which showed an underlying rhythm  of: NSR  Social Determinants of Health:  Diagnosis or treatment significantly limited by social determinants of health: na   Reevaluation: After the interventions noted above, I reevaluated the patient and found that they have resolved  Co morbidities that complicate the patient evaluation  Past Medical History:  Diagnosis Date   Hypertension       Dispostion: Disposition decision including need for hospitalization was considered, and patient discharged from emergency department.    Final Clinical Impression(s) / ED Diagnoses Final diagnoses:  Atypical chest pain  Heart palpitations        Sloan Leiter, DO 11/27/22 1341

## 2022-11-27 NOTE — Discharge Instructions (Addendum)
Your lab work today and x-ray were normal.  No evidence of a heart attack or life-threatening condition.  Please discuss with your PCP or cardiologist  need for cardiac monitoring such as a Zio patch    Return to the Emergency Department if you have unusual chest pain, pressure, or discomfort, shortness of breath, nausea, vomiting, burping, heartburn, tingling upper body parts, sweating, cold, clammy skin, or racing heartbeat. Call 911 if you think you are having a heart attack. Take all cardiac medications as prescribed - notify your doctor if you have any side effects. Follow cardiac diet - avoid fatty & fried foods, don't eat too much red meat, eat lots of fruits & vegetables, and dairy products should be low fat. Please lose weight if you are overweight. Become more active with walking, gardening, or any other activity that gets you to moving.     It was a pleasure caring for you today in the emergency department.  Please return to the emergency department for any worsening or worrisome symptoms.
